# Patient Record
Sex: Female | Born: 1990 | Race: White | Hispanic: No | Marital: Single | Smoking: Never smoker
Health system: Southern US, Community
[De-identification: ages and names within clinical notes are randomized; demographics above are authoritative.]

---
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
## 2010-06-11 LAB — CBC
HCT: 34.8 % — ABNORMAL LOW (ref 36.0–46.0)
Hemoglobin: 11.9 g/dL — ABNORMAL LOW (ref 12.0–15.0)
MCHC: 34.3 g/dL (ref 30.0–36.0)
MCV: 89.2 fL (ref 78.0–100.0)
Platelets: 139 10*3/uL — ABNORMAL LOW (ref 150–400)
RBC: 3.9 MIL/uL (ref 3.87–5.11)
RDW: 12.8 % (ref 11.5–15.5)
WBC: 9.2 10*3/uL (ref 4.0–10.5)

## 2010-06-11 LAB — URINALYSIS, ROUTINE W REFLEX MICROSCOPIC
Bilirubin Urine: NEGATIVE
Glucose, UA: NEGATIVE mg/dL
Ketones, ur: 40 mg/dL — AB
Protein, ur: 100 mg/dL — AB

## 2010-06-11 LAB — DIFFERENTIAL
Basophils Absolute: 0 10*3/uL (ref 0.0–0.1)
Basophils Relative: 0 % (ref 0–1)
Eosinophils Absolute: 0 10*3/uL (ref 0.0–0.7)
Eosinophils Relative: 0 % (ref 0–5)
Lymphocytes Relative: 8 % — ABNORMAL LOW (ref 12–46)
Lymphs Abs: 0.7 10*3/uL (ref 0.7–4.0)
Monocytes Absolute: 1.4 10*3/uL — ABNORMAL HIGH (ref 0.1–1.0)
Monocytes Relative: 15 % — ABNORMAL HIGH (ref 3–12)
Neutro Abs: 7.1 10*3/uL (ref 1.7–7.7)
Neutrophils Relative %: 77 % (ref 43–77)

## 2010-06-11 LAB — BASIC METABOLIC PANEL
BUN: 8 mg/dL (ref 6–23)
CO2: 24 mEq/L (ref 19–32)
Calcium: 8.7 mg/dL (ref 8.4–10.5)
Chloride: 100 mEq/L (ref 96–112)
Creatinine, Ser: 0.99 mg/dL (ref 0.4–1.2)
GFR calc Af Amer: >60 mL/min (ref >60)
GFR calc non Af Amer: >60 mL/min (ref >60)
Glucose, Bld: 97 mg/dL (ref 70–99)
Potassium: 3.5 mEq/L (ref 3.5–5.1)
Sodium: 133 mEq/L — ABNORMAL LOW (ref 135–145)

## 2010-06-11 LAB — PREGNANCY, URINE: Preg Test, Ur: NEGATIVE

## 2010-06-11 LAB — URINE CULTURE: Colony Count: >=100000

## 2010-06-11 LAB — URINE MICROSCOPIC-ADD ON

## 2010-06-25 LAB — DIFFERENTIAL
Basophils Absolute: 0 10*3/uL (ref 0.0–0.1)
Eosinophils Absolute: 0.1 10*3/uL (ref 0.0–0.7)
Eosinophils Relative: 1 % (ref 0–5)
Lymphocytes Relative: 5 % — ABNORMAL LOW (ref 12–46)

## 2010-06-25 LAB — BASIC METABOLIC PANEL
BUN: 12 mg/dL (ref 6–23)
Creatinine, Ser: 0.78 mg/dL (ref 0.4–1.2)
GFR calc non Af Amer: >60 mL/min (ref >60)
Glucose, Bld: 141 mg/dL — ABNORMAL HIGH (ref 70–99)
Potassium: 3.6 mEq/L (ref 3.5–5.1)

## 2010-06-25 LAB — CBC
HCT: 41.9 % (ref 36.0–46.0)
Platelets: 175 10*3/uL (ref 150–400)
RDW: 12.4 % (ref 11.5–15.5)

## 2010-06-25 LAB — URINALYSIS, ROUTINE W REFLEX MICROSCOPIC
Ketones, ur: >80 mg/dL — AB
Nitrite: NEGATIVE
Specific Gravity, Urine: >1.03 — ABNORMAL HIGH (ref 1.005–1.030)
Urobilinogen, UA: 0.2 mg/dL (ref 0.0–1.0)

## 2010-06-25 LAB — PREGNANCY, URINE: Preg Test, Ur: POSITIVE

## 2013-04-21 ENCOUNTER — Ambulatory Visit (INDEPENDENT_AMBULATORY_CARE_PROVIDER_SITE_OTHER): Admitting: Family Medicine

## 2013-04-21 ENCOUNTER — Encounter: Payer: Self-pay | Admitting: Family Medicine

## 2013-04-21 VITALS — BP 118/70 | Temp 98.5°F | Ht 66.0 in | Wt 133.0 lb

## 2013-04-21 DIAGNOSIS — N390 Urinary tract infection, site not specified: Secondary | ICD-10-CM

## 2013-04-21 DIAGNOSIS — R300 Dysuria: Secondary | ICD-10-CM

## 2013-04-21 DIAGNOSIS — O0001 Abdominal pregnancy with intrauterine pregnancy: Secondary | ICD-10-CM

## 2013-04-21 LAB — POCT URINALYSIS DIPSTICK
NITRITE UA: POSITIVE
PH UA: 6
Spec Grav, UA: 1.02

## 2013-04-21 MED ORDER — NITROFURANTOIN MONOHYD MACRO 100 MG PO CAPS: 100.0000 mg | ORAL_CAPSULE | Freq: Two times a day (BID) | ORAL | Status: AC

## 2013-04-21 NOTE — Progress Notes (Signed)
   Subjective:    Patient ID: Melissa Gentry, female    DOB: [birth year], 23 y.o.   MRN: [id number]  Urinary Tract Infection  This is a recurrent problem. The problem occurs every urination. The quality of the pain is described as aching. There has been no fever. Associated symptoms include a discharge, flank pain and frequency. Associated symptoms comments: Back pain. She has tried antibiotics for the symptoms. The treatment provided no relief. Her past medical history is significant for recurrent UTIs.   Urinate no sig burning  Just increased pressure  [redacted] weeks gestation   No fev or chils  Pos nocturia Review of Systems  Genitourinary: Positive for frequency and flank pain.   no nausea no vomiting no chills no chest pain ROS otherwise negative     Objective:   Physical Exam  Alert no apparent distress. Vital stable. Lungs clear. Heart rare rhythm. No CVA tenderness. Abdomen benign.  Urinalysis numerous white blood cells    Assessment & Plan:  Impression urinary tract infection. Plan Macrobid twice a day 7 days. Local measures discussed. Followup with OB/GYN soon. [facility]SL

## 2014-01-22 ENCOUNTER — Encounter: Payer: Self-pay | Admitting: Family Medicine

## 2019-04-24 ENCOUNTER — Encounter: Payer: Self-pay | Admitting: Family Medicine
